# Patient Record
Sex: Female | Born: 1955 | Race: White | Hispanic: No | Marital: Married | Smoking: Never smoker
Health system: Southern US, Community
[De-identification: ages and names within clinical notes are randomized; demographics above are authoritative.]

## PROBLEM LIST (undated history)

## (undated) DIAGNOSIS — Z789 Other specified health status: Secondary | ICD-10-CM

## (undated) HISTORY — PX: ECTOPIC PREGNANCY SURGERY: SHX613

---

## 2017-08-17 ENCOUNTER — Observation Stay (HOSPITAL_COMMUNITY): Payer: BLUE CROSS/BLUE SHIELD

## 2017-08-17 ENCOUNTER — Emergency Department (HOSPITAL_COMMUNITY): Payer: BLUE CROSS/BLUE SHIELD

## 2017-08-17 ENCOUNTER — Observation Stay (HOSPITAL_COMMUNITY)
Admission: EM | Admit: 2017-08-17 | Discharge: 2017-08-18 | Disposition: A | Discharge status: Home / Self Care | Payer: BLUE CROSS/BLUE SHIELD | Attending: Internal Medicine | Admitting: Internal Medicine

## 2017-08-17 ENCOUNTER — Encounter (HOSPITAL_COMMUNITY): Payer: Self-pay | Admitting: *Deleted

## 2017-08-17 DIAGNOSIS — F419 Anxiety disorder, unspecified: Secondary | ICD-10-CM | POA: Insufficient documentation

## 2017-08-17 DIAGNOSIS — D320 Benign neoplasm of cerebral meninges: Secondary | ICD-10-CM | POA: Insufficient documentation

## 2017-08-17 DIAGNOSIS — G459 Transient cerebral ischemic attack, unspecified: Secondary | ICD-10-CM

## 2017-08-17 DIAGNOSIS — R479 Unspecified speech disturbances: Secondary | ICD-10-CM

## 2017-08-17 DIAGNOSIS — G930 Cerebral cysts: Secondary | ICD-10-CM | POA: Insufficient documentation

## 2017-08-17 DIAGNOSIS — R531 Weakness: Secondary | ICD-10-CM

## 2017-08-17 DIAGNOSIS — R42 Dizziness and giddiness: Principal | ICD-10-CM | POA: Insufficient documentation

## 2017-08-17 DIAGNOSIS — I4510 Unspecified right bundle-branch block: Secondary | ICD-10-CM | POA: Diagnosis not present

## 2017-08-17 DIAGNOSIS — R110 Nausea: Secondary | ICD-10-CM | POA: Diagnosis present

## 2017-08-17 DIAGNOSIS — Z79899 Other long term (current) drug therapy: Secondary | ICD-10-CM | POA: Insufficient documentation

## 2017-08-17 DIAGNOSIS — R0602 Shortness of breath: Secondary | ICD-10-CM

## 2017-08-17 HISTORY — DX: Other specified health status: Z78.9

## 2017-08-17 LAB — I-STAT BETA HCG BLOOD, ED (MC, WL, AP ONLY): HCG, QUANTITATIVE: 5.9 m[IU]/mL — AB (ref <5)

## 2017-08-17 LAB — COMPREHENSIVE METABOLIC PANEL WITH GFR
ALT: 19 U/L (ref 14–54)
AST: 16 U/L (ref 15–41)
Albumin: 4.3 g/dL (ref 3.5–5.0)
Alkaline Phosphatase: 90 U/L (ref 38–126)
Anion gap: 8 (ref 5–15)
BUN: 23 mg/dL — ABNORMAL HIGH (ref 6–20)
CO2: 26 mmol/L (ref 22–32)
Calcium: 9.2 mg/dL (ref 8.9–10.3)
Chloride: 103 mmol/L (ref 101–111)
Creatinine, Ser: 0.7 mg/dL (ref 0.44–1.00)
GFR calc Af Amer: >60 mL/min
GFR calc non Af Amer: >60 mL/min
Glucose, Bld: 136 mg/dL — ABNORMAL HIGH (ref 65–99)
Potassium: 4 mmol/L (ref 3.5–5.1)
Sodium: 137 mmol/L (ref 135–145)
Total Bilirubin: 0.4 mg/dL (ref 0.3–1.2)
Total Protein: 7.3 g/dL (ref 6.5–8.1)

## 2017-08-17 LAB — URINALYSIS, ROUTINE W REFLEX MICROSCOPIC
BILIRUBIN URINE: NEGATIVE
Glucose, UA: NEGATIVE mg/dL
Hgb urine dipstick: NEGATIVE
Ketones, ur: NEGATIVE mg/dL
LEUKOCYTES UA: NEGATIVE
NITRITE: NEGATIVE
Protein, ur: NEGATIVE mg/dL
Specific Gravity, Urine: 1.014 (ref 1.005–1.030)
pH: 6 (ref 5.0–8.0)

## 2017-08-17 LAB — PROTIME-INR
INR: 1.07
Prothrombin Time: 13.8 seconds (ref 11.4–15.2)

## 2017-08-17 LAB — CBC WITH DIFFERENTIAL/PLATELET
BASOS ABS: 0 10*3/uL (ref 0.0–0.1)
Basophils Relative: 0 %
Eosinophils Absolute: 0.1 10*3/uL (ref 0.0–0.7)
Eosinophils Relative: 1 %
HEMATOCRIT: 42.7 % (ref 36.0–46.0)
Hemoglobin: 13.8 g/dL (ref 12.0–15.0)
Lymphocytes Relative: 20 %
Lymphs Abs: 1.9 10*3/uL (ref 0.7–4.0)
MCH: 28.9 pg (ref 26.0–34.0)
MCHC: 32.3 g/dL (ref 30.0–36.0)
MCV: 89.5 fL (ref 78.0–100.0)
MONO ABS: 0.5 10*3/uL (ref 0.1–1.0)
Monocytes Relative: 5 %
NEUTROS ABS: 7 10*3/uL (ref 1.7–7.7)
Neutrophils Relative %: 74 %
Platelets: 253 10*3/uL (ref 150–400)
RBC: 4.77 MIL/uL (ref 3.87–5.11)
RDW: 13.6 % (ref 11.5–15.5)
WBC: 9.5 10*3/uL (ref 4.0–10.5)

## 2017-08-17 LAB — LIPASE, BLOOD: Lipase: 25 U/L (ref 11–51)

## 2017-08-17 LAB — RAPID URINE DRUG SCREEN, HOSP PERFORMED
AMPHETAMINES: NOT DETECTED
Barbiturates: NOT DETECTED
Benzodiazepines: NOT DETECTED
Cocaine: NOT DETECTED
OPIATES: NOT DETECTED
Tetrahydrocannabinol: NOT DETECTED

## 2017-08-17 LAB — I-STAT TROPONIN, ED: Troponin i, poc: 0 ng/mL (ref 0.00–0.08)

## 2017-08-17 LAB — APTT: APTT: 26 s (ref 24–36)

## 2017-08-17 LAB — POC URINE PREG, ED: PREG TEST UR: NEGATIVE

## 2017-08-17 LAB — ETHANOL: Alcohol, Ethyl (B): <10 mg/dL (ref <10)

## 2017-08-17 MED ORDER — SPIRONOLACTONE 25 MG PO TABS
100.0000 mg | ORAL_TABLET | Freq: Every day | ORAL | Status: DC
  Administered 2017-08-18: 100 mg via ORAL
  Filled 2017-08-17: qty 4

## 2017-08-17 MED ORDER — MECLIZINE HCL 25 MG PO TABS: 25.0000 mg | ORAL_TABLET | Freq: Three times a day (TID) | ORAL | Status: DC | PRN

## 2017-08-17 MED ORDER — FLUOXETINE HCL 20 MG PO CAPS
20.0000 mg | ORAL_CAPSULE | Freq: Every day | ORAL | Status: DC
  Administered 2017-08-18: 20 mg via ORAL
  Filled 2017-08-17 (×3): qty 1

## 2017-08-17 MED ORDER — MECLIZINE HCL 25 MG PO TABS
50.0000 mg | ORAL_TABLET | Freq: Once | ORAL | Status: AC
  Administered 2017-08-17: 50 mg via ORAL
  Filled 2017-08-17: qty 2

## 2017-08-17 MED ORDER — ONDANSETRON HCL 4 MG/2ML IJ SOLN: 4.0000 mg | Freq: Four times a day (QID) | INTRAMUSCULAR | Status: DC | PRN

## 2017-08-17 MED ORDER — ONDANSETRON HCL 4 MG PO TABS: 4.0000 mg | ORAL_TABLET | Freq: Four times a day (QID) | ORAL | Status: DC | PRN

## 2017-08-17 MED ORDER — SODIUM CHLORIDE 0.9 % IV BOLUS
1000.0000 mL | Freq: Once | INTRAVENOUS | Status: AC
  Administered 2017-08-17: 1000 mL via INTRAVENOUS

## 2017-08-17 MED ORDER — ENOXAPARIN SODIUM 40 MG/0.4ML ~~LOC~~ SOLN
40.0000 mg | SUBCUTANEOUS | Status: DC
  Administered 2017-08-18: 40 mg via SUBCUTANEOUS
  Filled 2017-08-17 (×2): qty 0.4

## 2017-08-17 NOTE — ED Notes (Signed)
ED TO INPATIENT HANDOFF REPORT  Name/Age/Gender Andrea Luna 62 y.o. female  Code Status   Home/SNF/Other Home  Chief Complaint weakness / nausea  Level of Care/Admitting Diagnosis ED Disposition    ED Disposition Condition Comment   [facility] Area: [place] [[id number]]  Level of Care: Telemetry [5]  Admit to tele based on following criteria: Other see comments  Comments: Vertigo  Diagnosis: Vertigo [093818]  Admitting Physician: Kara Pacer  Attending Physician: Bethena Roys Nessa.Cuff  PT Class (Do Not Modify): Observation [104]  PT Acc Code (Do Not Modify): Observation [10022]       Medical History Past Medical History:  Diagnosis Date  . Medical history non-contributory     Allergies No Known Allergies  IV Location/Drains/Wounds Patient Lines/Drains/Airways Status   Active Line/Drains/Airways    Name:   Placement date:   Placement time:   Site:   Days:   Peripheral IV 08/17/17 Right Hand   08/17/17    -    Hand   less than 1          Labs/Imaging Results for orders placed or performed during the hospital encounter of 08/17/17 (from the past 48 hour(s))  Protime-INR     Status: None   Collection Time: 08/17/17  3:10 PM  Result Value Ref Range   Prothrombin Time 13.8 11.4 - 15.2 seconds   INR 1.07     Comment: Performed at [facility], [place] [address] [place], [place] [address]  APTT     Status: None   Collection Time: 08/17/17  3:10 PM  Result Value Ref Range   aPTT 26 24 - 36 seconds    Comment: Performed at [facility], [place] [address], [place], [place] [address]  CBC with Differential     Status: None   Collection Time: 08/17/17  3:11 PM  Result Value Ref Range   WBC 9.5 4.0 - 10.5 K/uL   RBC 4.77 3.87 - 5.11 MIL/uL   Hemoglobin 13.8 12.0 - 15.0 g/dL   HCT 42.7 36.0 - 46.0 %   MCV 89.5 78.0 - 100.0 fL   MCH 28.9 26.0 - 34.0 pg   MCHC 32.3 30.0 - 36.0  g/dL   RDW 13.6 11.5 - 15.5 %   Platelets 253 150 - 400 K/uL   Neutrophils Relative % 74 %   Neutro Abs 7.0 1.7 - 7.7 K/uL   Lymphocytes Relative 20 %   Lymphs Abs 1.9 0.7 - 4.0 K/uL   Monocytes Relative 5 %   Monocytes Absolute 0.5 0.1 - 1.0 K/uL   Eosinophils Relative 1 %   Eosinophils Absolute 0.1 0.0 - 0.7 K/uL   Basophils Relative 0 %   Basophils Absolute 0.0 0.0 - 0.1 K/uL    Comment: Performed at [facility], [place] [address], [place], [place] [address]  Comprehensive metabolic panel     Status: Abnormal   Collection Time: 08/17/17  3:11 PM  Result Value Ref Range   Sodium 137 135 - 145 mmol/L   Potassium 4.0 3.5 - 5.1 mmol/L   Chloride 103 101 - 111 mmol/L   CO2 26 22 - 32 mmol/L   Glucose, Bld 136 (H) 65 - 99 mg/dL   BUN 23 (H) 6 - 20 mg/dL   Creatinine, Ser 0.70 0.44 - 1.00 mg/dL   Calcium 9.2 8.9 - 10.3 mg/dL   Total Protein 7.3 6.5 - 8.1 g/dL   Albumin  4.3 3.5 - 5.0 g/dL   AST 16 15 - 41 U/L   ALT 19 14 - 54 U/L   Alkaline Phosphatase 90 38 - 126 U/L   Total Bilirubin 0.4 0.3 - 1.2 mg/dL   GFR calc non Af Amer >60 >60 mL/min   GFR calc Af Amer >60 >60 mL/min    Comment: (NOTE) The eGFR has been calculated using the CKD EPI equation. This calculation has not been validated in all clinical situations. eGFR's persistently <60 mL/min signify possible Chronic Kidney Disease.    Anion gap 8 5 - 15    Comment: Performed at [facility], [place] [address], [place], [place] [address]  Lipase, blood     Status: None   Collection Time: 08/17/17  3:11 PM  Result Value Ref Range   Lipase 25 11 - 51 U/L    Comment: Performed at [facility], [place] [address], [place], [place] [address]  I-Stat beta hCG blood, ED     Status: Abnormal   Collection Time: 08/17/17  3:15 PM  Result Value Ref Range   I-stat hCG, quantitative 5.9 (H) <5 mIU/mL   Comment 3            Comment:   GEST. AGE      CONC.  (mIU/mL)   <=1 WEEK         5 - 50     2 WEEKS       50 - 500     3 WEEKS       100 - 10,000     4 WEEKS     1,000 - 30,000        FEMALE AND NON-PREGNANT FEMALE:     LESS THAN 5 mIU/mL   I-stat troponin, ED     Status: None   Collection Time: 08/17/17  3:16 PM  Result Value Ref Range   Troponin i, poc 0.00 0.00 - 0.08 ng/mL   Comment 3            Comment: Due to the release kinetics of cTnI, a negative result within the first hours of the onset of symptoms does not rule out myocardial infarction with certainty. If myocardial infarction is still suspected, repeat the test at appropriate intervals.   Ethanol     Status: None   Collection Time: 08/17/17  3:35 PM  Result Value Ref Range   Alcohol, Ethyl (B) <10 <10 mg/dL    Comment: (NOTE) Lowest detectable limit for serum alcohol is 10 mg/dL. For medical purposes only. Performed at [facility], [place] [address] [place], [place] [address]   Urinalysis, Routine w reflex microscopic     Status: None   Collection Time: 08/17/17  5:39 PM  Result Value Ref Range   Color, Urine YELLOW YELLOW   APPearance CLEAR CLEAR   Specific Gravity, Urine 1.014 1.005 - 1.030   pH 6.0 5.0 - 8.0   Glucose, UA NEGATIVE NEGATIVE mg/dL   Hgb urine dipstick NEGATIVE NEGATIVE   Bilirubin Urine NEGATIVE NEGATIVE   Ketones, ur NEGATIVE NEGATIVE mg/dL   Protein, ur NEGATIVE NEGATIVE mg/dL   Nitrite NEGATIVE NEGATIVE   Leukocytes, UA NEGATIVE NEGATIVE    Comment: Performed at [place] [address], [place], [place] [address]  Urine rapid drug screen (hosp performed)     Status: None   Collection Time: 08/17/17  5:39 PM  Result Value Ref Range   Opiates NONE DETECTED  NONE DETECTED   Cocaine NONE DETECTED NONE DETECTED   Benzodiazepines NONE DETECTED NONE DETECTED   Amphetamines NONE DETECTED NONE DETECTED   Tetrahydrocannabinol NONE DETECTED NONE DETECTED   Barbiturates NONE DETECTED NONE DETECTED    Comment: (NOTE) DRUG SCREEN  FOR MEDICAL PURPOSES ONLY.  IF CONFIRMATION IS NEEDED FOR ANY PURPOSE, NOTIFY LAB WITHIN 5 DAYS. LOWEST DETECTABLE LIMITS FOR URINE DRUG SCREEN Drug Class                     Cutoff (ng/mL) Amphetamine and metabolites    1000 Barbiturate and metabolites    200 Benzodiazepine                 035 Tricyclics and metabolites     300 Opiates and metabolites        300 Cocaine and metabolites        300 THC                            50 Performed at [facility], [place] [address] [place], [place] [address]   POC urine preg, ED (not at [facility])     Status: None   Collection Time: 08/17/17  5:40 PM  Result Value Ref Range   Preg Test, Ur NEGATIVE NEGATIVE    Comment:        THE SENSITIVITY OF THIS METHODOLOGY IS >24 mIU/mL    Dg Chest 2 View  Result Date: 08/17/2017 CLINICAL DATA:  Chest pain, dizziness, cold sweats,generalized weakness and nausea. Patient state she was driving went she suddenly fell weakness all over her body. EXAM: CHEST - 2 VIEW COMPARISON:  None. FINDINGS: Lateral view degraded by patient arm position. Numerous leads and wires project over the chest. Midline trachea. Borderline cardiomegaly. No pleural effusion or pneumothorax. Clear lungs. IMPRESSION: Borderline cardiomegaly, without acute disease. Electronically Signed   By: Abigail Miyamoto M.D.   On: 08/17/2017 19:50   Ct Head Wo Contrast  Result Date: 08/17/2017 CLINICAL DATA:  Dizziness and nausea.  Transient facial numbness EXAM: CT HEAD WITHOUT CONTRAST TECHNIQUE: Contiguous axial images were obtained from the base of the skull through the vertex without intravenous contrast. COMPARISON:  None. FINDINGS: Brain: The ventricles are normal in size and configuration. There is an arachnoid cyst in the anterior left temporal region measuring 2.6 x 1.4 cm with remodeling of the anterior left temporal lobe. There is a calcified meningioma arising from the dura in the high right parietal region measuring 1.6 x  1.5 cm without surrounding edema or mass effect. No other mass evident. There is no hemorrhage, extra-axial fluid collection, or midline shift. The gray-white compartments appear normal. No evident acute infarct. Vascular: No hyperdense vessel. No vascular calcifications are evident. Skull: The bony calvarium appears intact. Sinuses/Orbits: There is mucosal thickening in several ethmoid air cells. Other paranasal sinuses which are visualized are clear. Orbits appear symmetric bilaterally. Other: Mastoid air cells are clear. IMPRESSION: 1. 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 2. Calcified meningioma arising from the dural in the high right parietal region without surrounding edema or mass effect. 3. No intra-axial mass or hemorrhage. Gray-white compartments appear normal. No acute infarct evident. 4.  Mucosal thickening in several ethmoid air cells. Electronically Signed   By: Lowella Grip III M.D.   On: 08/17/2017 16:09    Pending Labs Unresulted Labs (From admission, onward)  Start     Ordered   Signed and Held  HIV antibody (Routine Testing)  Once,   R     Signed and Held      Vitals/Pain Today's Vitals   08/17/17 1600 08/17/17 1755 08/17/17 1900 08/17/17 2000  BP: 132/65 (!) 143/72 137/75 129/67  Pulse: 66 71 72 71  Resp: _0 Temp:      TempSrc:      SpO2: 98% 93% 95% 94%  Weight:      Height:      PainSc:        Isolation Precautions No active isolations  Medications Medications  sodium chloride 0.9 % bolus 1,000 mL (0 mLs Intravenous Stopped 08/17/17 1703)  meclizine (ANTIVERT) tablet 50 mg (50 mg Oral Given 08/17/17 1538)    Mobility walks

## 2017-08-17 NOTE — ED Provider Notes (Signed)
[facility] DEPT Provider Note   CSN: [id number] Arrival date & time: 08/17/17  1402     History   Chief Complaint No chief complaint on file.   HPI Andrea Luna is a 62 y.o. otherwise healthy female with no reported PMHx, who presents to the ED via EMS accompanied by her daughters who assist with history, with complaints of sudden onset generalized weakness, vertigo/dizzy spinning sensation, nausea, diaphoresis, difficulty word finding, and difficulty focusing her vision due to the spinning sensation, which all occurred at 1 PM while she was sitting in her car in the [employer] food line.  She states that she was sitting there when she started not feeling well, she called her daughter and her daughter states that she seemed to not be able to get her words out, was not communicating clearly.  Patient states that she "couldn't see" because she felt like she was spinning and she couldn't focus on the things in front of her; this was binocular, and she denies any loss of vision, spots in vision, or diplopia.  She reports that she felt very diaphoretic and nauseated, and just felt generally fatigued and weak.  She initially felt somewhat short of breath however this has resolved.  EMS gave her 4 mg IV Zofran which did not help her symptoms, and she states that the movement of the ambulance on the ride over here made her symptoms worse.  She reports her symptoms started to improve once she got here however she continues to feel very fatigued and generally weak.  At 7 AM this morning she had a sausage on an English muffin with coffee and some water, she has had a little bit of water since then but she has not eaten anything since 7 AM.  Her PCP is Lanier Prude.   She denies having any PMHx that she's aware of.    She denies fevers, chills, HA, diplopia/loss of vision, ear pain/drainage, tinnitus, CP, ongoing SOB, abd pain, vomiting, recent diarrhea/constipation, melena,  hematochezia, hematuria, dysuria, myalgias, arthralgias, numbness, tingling, focal unilateral weakness, or any other complaints at this time.   The history is provided by the patient, medical records and a relative. No language interpreter was used.    No past medical history on file.  There are no active problems to display for this patient.     OB History   None      Home Medications    Prior to Admission medications   Medication Sig Start Date End Date Taking? Authorizing Provider  FLUoxetine (PROZAC) 20 MG capsule Take 20 mg by mouth daily.   Yes [provider]  spironolactone (ALDACTONE) 100 MG tablet Take 100 mg by mouth daily.   Yes [provider]    Family History No family history on file.  Social History Social History   Tobacco Use  . Smoking status: Not on file  Substance Use Topics  . Alcohol use: Not on file  . Drug use: Not on file     Allergies   Patient has no known allergies.   Review of Systems Review of Systems  Constitutional: Positive for diaphoresis and fatigue. Negative for chills and fever.  HENT: Negative for ear discharge, ear pain and tinnitus.   Eyes: Positive for visual disturbance (difficulty focusing due to vertigo).  Respiratory: Positive for shortness of breath (initially but not ongoing).   Cardiovascular: Negative for chest pain.  Gastrointestinal: Positive for nausea. Negative for abdominal pain, blood in stool,  constipation, diarrhea and vomiting.  Genitourinary: Negative for dysuria and hematuria.  Musculoskeletal: Negative for arthralgias and myalgias.  Skin: Negative for color change.  Allergic/Immunologic: Negative for immunocompromised state.  Neurological: Positive for dizziness and speech difficulty. Negative for syncope, weakness, numbness and headaches.   All other systems reviewed and are negative for acute change except as noted in the HPI.    Physical Exam Updated Vital Signs BP 137/78  (BP Location: Left Arm)   Pulse 61   Temp (!) 97.5 F (36.4 C) (Oral)   Resp 13   Ht 5\' 8"  (1.727 m)   Wt 97.5 kg (215 lb)   SpO2 94%   BMI 32.69 kg/m   Physical Exam  Constitutional: She is oriented to person, place, and time. Vital signs are normal. She appears well-developed and well-nourished.  Non-toxic appearance. No distress.  Afebrile, nontoxic, NAD  HENT:  Head: Normocephalic and atraumatic.  Mouth/Throat: Oropharynx is clear and moist and mucous membranes are normal.  Symmetric facial movements  Eyes: Pupils are equal, round, and reactive to light. Conjunctivae and EOM are normal. Right eye exhibits no discharge. Left eye exhibits no discharge.  PERRL, EOMI, no nystagmus  Neck: Normal range of motion. Neck supple. No spinous process tenderness and no muscular tenderness present. No neck rigidity. Normal range of motion present.  FROM intact without spinous process TTP, no bony stepoffs or deformities, no paraspinous muscle TTP or muscle spasms. No rigidity or meningeal signs. No bruising or swelling.   Cardiovascular: Normal rate, regular rhythm, normal heart sounds and intact distal pulses. Exam reveals no gallop and no friction rub.  No murmur heard. Pulmonary/Chest: Effort normal and breath sounds normal. No respiratory distress. She has no decreased breath sounds. She has no wheezes. She has no rhonchi. She has no rales.  Abdominal: Soft. Normal appearance and bowel sounds are normal. She exhibits no distension. There is no tenderness. There is no rigidity, no rebound, no guarding, no CVA tenderness, no tenderness at McBurney's point and negative Murphy's sign.  Musculoskeletal: Normal range of motion.  Not very cooperative with musculoskeletal testing however appears to move all extremities symmetrically, overall strength is symmetric and grossly intact in all extremities although she is not very cooperative with this portion of the exam; later seen showing a child the IV in  her R hand using her L hand to point to it; sensation grossly intact in all extremities Distal pulses intact  Neurological: She is alert and oriented to person, place, and time. She has normal strength. No cranial nerve deficit or sensory deficit. Coordination normal. GCS eye subscore is 4. GCS verbal subscore is 5. GCS motor subscore is 6.  CN 2-12 grossly intact A&O x4 GCS 15 Sensation and strength intact as mentioned previously Gait not assessed initially Coordination WNL Neg pronator drift  Speech clear  Skin: Skin is warm, dry and intact. No rash noted.  Psychiatric: She has a normal mood and affect.  Nursing note and vitals reviewed.    ED Treatments / Results  Labs (all labs ordered are listed, but only abnormal results are displayed) Labs Reviewed  COMPREHENSIVE METABOLIC PANEL - Abnormal; Notable for the following components:      Result Value   Glucose, Bld 136 (*)    BUN 23 (*)    All other components within normal limits  I-STAT BETA HCG BLOOD, ED (MC, WL, AP ONLY) - Abnormal; Notable for the following components:   I-stat hCG, quantitative 5.9 (*)  All other components within normal limits  URINALYSIS, ROUTINE W REFLEX MICROSCOPIC  CBC WITH DIFFERENTIAL/PLATELET  LIPASE, BLOOD  ETHANOL  PROTIME-INR  APTT  RAPID URINE DRUG SCREEN, HOSP PERFORMED  CBG MONITORING, ED  I-STAT TROPONIN, ED  POC URINE PREG, ED    EKG EKG Interpretation  Date/Time:  Tuesday August 17 2017 15:11:54 EDT Ventricular Rate:  71 PR Interval:    QRS Duration: 108 QT Interval:  410 QTC Calculation: 446 R Axis:   3 Text Interpretation:  Age not entered, assumed to be  62 years old for purpose of ECG interpretation Sinus rhythm Abnormal R-wave progression, early transition Confirmed by Milton Ferguson [phone]) on 08/17/2017 4:55:40 PM   Radiology Ct Head Wo Contrast  Result Date: 08/17/2017 CLINICAL DATA:  Dizziness and nausea.  Transient facial numbness EXAM: CT HEAD WITHOUT  CONTRAST TECHNIQUE: Contiguous axial images were obtained from the base of the skull through the vertex without intravenous contrast. COMPARISON:  None. FINDINGS: Brain: The ventricles are normal in size and configuration. There is an arachnoid cyst in the anterior left temporal region measuring 2.6 x 1.4 cm with remodeling of the anterior left temporal lobe. There is a calcified meningioma arising from the dura in the high right parietal region measuring 1.6 x 1.5 cm without surrounding edema or mass effect. No other mass evident. There is no hemorrhage, extra-axial fluid collection, or midline shift. The gray-white compartments appear normal. No evident acute infarct. Vascular: No hyperdense vessel. No vascular calcifications are evident. Skull: The bony calvarium appears intact. Sinuses/Orbits: There is mucosal thickening in several ethmoid air cells. Other paranasal sinuses which are visualized are clear. Orbits appear symmetric bilaterally. Other: Mastoid air cells are clear. IMPRESSION: 1. 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 2. Calcified meningioma arising from the dural in the high right parietal region without surrounding edema or mass effect. 3. No intra-axial mass or hemorrhage. Gray-white compartments appear normal. No acute infarct evident. 4.  Mucosal thickening in several ethmoid air cells. Electronically Signed   By: Lowella Grip III M.D.   On: 08/17/2017 16:09    Procedures Procedures (including critical care time)  Medications Ordered in ED Medications  sodium chloride 0.9 % bolus 1,000 mL (1,000 mLs Intravenous New Bag/Given 08/17/17 1541)  meclizine (ANTIVERT) tablet 50 mg (50 mg Oral Given 08/17/17 1538)     Initial Impression / Assessment and Plan / ED Course  I have reviewed the triage vital signs and the nursing notes.  Pertinent labs & imaging results that were available during my care of the  patient were reviewed by me and considered in my medical decision making (see chart for details).     62 y.o. female here with sudden onset generalized weakness/fatigue, dizziness, nausea, difficulty word finding, and diaphoresis. Initially SOB but no longer feels that way. Hasn't eaten much today. On exam, no focal neuro deficits, not very cooperative with strength testing but appears to be able to move all extremities without difficulty and strength is symmetrically and grossly intact. A&O x4. No diaphoretic on exam. Clear lung exam, no tachycardia or hypoxia. Does not have any difficulty word finding or any slurred speech on exam. Doubt need for code stroke, but will get stroke-like work up including labs, CT head, and EKG. Will give meclizine and fluids and reassess shortly. Discussed case with my attending Dr. Dayna Barker who agrees with plan.   4:56 PM CBC w/diff WNL. CMP with marginally elevated  gluc 136 and BUN 23 but otherwise WNL. Lipase WNL. BetaHCG falsely elevated at 5.9, doubt true positive. EtOH level undetectable. INR WNL. APTT WNL. Trop negative. EKG without acute ischemic findings. CT head showing benign arachnoid cyst in anterior L temporal region with chronic remodeling of anterior left temporal lobe, calcified meningioma arising from dural in high right parietal region without surrounding edema or mass effect, and mucosal thickening of ethmoid air cells, but otherwise no acute findings. Will discuss case with neurosurgeon to see if these findings would correlate with her symptoms from today. Of note, pt wants Upreg to ensure that the [facility] is actually a false positive; will get this now as well. Also, pt feeling somewhat better after fluids and meclizine.   5:41 PM Dr. Annette Stable of neurosurgery returning page states meningioma should be followed outpatient but none of these findings would correlate directly with her symptoms today. Doesn't feel this is the cause of her symptoms. She can  f/up with him outpatient to discuss further monitoring of the meningioma. 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 Will proceed with admission for TIA work up. Upreg negative, rest of urine sample tests are pending.    6:38 PM UDS negative. U/A unremarkable. Dr. Denton Brick of [facility] returning page and will admit. Holding orders to be placed by admitting team. Please see their notes for further documentation of care. I appreciate their help with this pleasant pt's care. Pt stable at time of admission.    Final Clinical Impressions(s) / ED Diagnoses   Final diagnoses:  Transient ischemic attack (TIA)  Generalized weakness  Nausea  Vertigo  Difficulty with speech    ED Discharge Orders    [address] [place], [place] 08/17/17 Cecille Aver, MD 08/18/17 [phone]

## 2017-08-17 NOTE — ED Triage Notes (Signed)
Patient presented to ed with c/o generalized weakness and nausea. Patient state she was driving went she suddenly fell weakness all over her body. Patient was given 4 mg of Zofran iv

## 2017-08-17 NOTE — ED Notes (Signed)
Gave report to Ray, RN for room 1427.

## 2017-08-17 NOTE — ED Notes (Signed)
Bed: WA08 Expected date:  Expected time:  Means of arrival:  Comments: Ems

## 2017-08-17 NOTE — H&P (Addendum)
History and Physical    Teesha Klinker HYW:[id number] DOB: Nov 09, 1955 DOA: 08/17/2017  PCP: Manfred Shirts, PA   Patient coming from: Home   Chief Complaint: vertigo  HPI: Andrea Luna is a 62 y.o. female with medical history significant for anxiety, presented to the ED with complaints of sudden dizziness that started while she was sitting in her car waiting in line at [facility].  She describes the sensation of her head spinning, such that it hurt to open her eyes, with associated nausea but no vomiting.  She tried to drive was unable and stopped by the side of the road.  She reports vision change was because when she opened her eyes she was very dizzy.  Patient reports subsequently feeling "very sweaty, and drained/exhausted such that she could not lift her arms or legs.  At this time she also had an episode of difficulty catching her breath lasting a few minutes that resolved.  Patient ate an egg muffin for breakfast, denies-vomiting, loose stools or abdominal pain, she denies chest pain, no cough no leg swelling.  She denies ringing in her ears, hearing problems, ear fullness or pain. Patient subsequently called her daughter who felt patient was having difficulty getting her words out, daughter reports patient's words did not make a lot of sense but patient reports she knew exactly what she was saying.  ED Course: Temperature 97.5 stable vitals.  Troponin x1-, UA UDS clean, WBC 9.5 hemoglobin 13.8, creatinine 0.7 , glucose 136 , unremarkable lytes.  Negative blood alcohol. CT without contrast-benign arachnoid cyst-left temporal region, calcified meningioma high right parietal region- ( See detailed report).  Neurosurgery was consulted in the ED with recommendations that-arachnoid cyst likely present from birth, imaging findings do not correlate with patient's symptoms, patient can follow-up with him as outpatient for further monitoring of her meningioma.   Patient was given meclizine in the ED. At  the time of my evaluation patient felt back to baseline. Hospitalist was called to admit for TIA work-up.   Review of Systems: As per HPI otherwise 10 point review of systems negative.  Past Medical History:  Diagnosis Date  . Medical history non-contributory     Past Surgical History:  Procedure Laterality Date  . ECTOPIC PREGNANCY SURGERY       reports that she has never smoked. She has never used smokeless tobacco. She reports that she does not drink alcohol or use drugs.  No Known Allergies  History reviewed. No pertinent family history.  Prior to Admission medications   Medication Sig Start Date End Date Taking? Authorizing Provider  FLUoxetine (PROZAC) 20 MG capsule Take 20 mg by mouth daily.   Yes [provider]  spironolactone (ALDACTONE) 100 MG tablet Take 100 mg by mouth daily.   Yes [provider]    Physical Exam: Vitals:   08/17/17 1600 08/17/17 1755 08/17/17 1900 08/17/17 2000  BP: 132/65 (!) 143/72 137/75 129/67  Pulse: 66 71 72 71  Resp: 12 18 17 17   Temp:      TempSrc:      SpO2: 98% 93% 95% 94%  Weight:      Height:        Constitutional: NAD, calm, comfortable Vitals:   08/17/17 1600 08/17/17 1755 08/17/17 1900 08/17/17 2000  BP: 132/65 (!) 143/72 137/75 129/67  Pulse: 66 71 72 71  Resp: 12 18 17 17   Temp:      TempSrc:      SpO2: 98% 93% 95% 94%  Weight:      Height:       Eyes: PERRL, lids and conjunctivae normal ENMT: Mucous membranes are moist. Posterior pharynx clear of any exudate or lesions.Normal dentition.  Neck: normal, supple, no masses, no thyromegaly Respiratory: clear to auscultation bilaterally, no wheezing, no crackles. Normal respiratory effort. No accessory muscle use.  Cardiovascular: Regular rate and rhythm, no murmurs / rubs / gallops. No extremity edema. 2+ pedal pulses. No carotid bruits.  Abdomen: no tenderness, no masses palpated. No hepatosplenomegaly. Bowel sounds positive.  Musculoskeletal: no  clubbing / cyanosis. No joint deformity upper and lower extremities. Good ROM, no contractures. Normal muscle tone.  Skin: no rashes, lesions, ulcers. No induration Neurologic: CN 2-12 grossly intact. Sensation intact, . Strength 5/5 in all 4. Finger-to-nose, heel-to-shin test normal Psychiatric: Normal judgment and insight. Alert and oriented x 3. Normal mood.   Labs on Admission: I have personally reviewed following labs and imaging studies  CBC: Recent Labs  Lab 08/17/17 1511  WBC 9.5  NEUTROABS 7.0  HGB 13.8  HCT 42.7  MCV 89.5  PLT 756   Basic Metabolic Panel: Recent Labs  Lab 08/17/17 1511  NA 137  K 4.0  CL 103  CO2 26  GLUCOSE 136*  BUN 23*  CREATININE 0.70  CALCIUM 9.2   Liver Function Tests: Recent Labs  Lab 08/17/17 1511  AST 16  ALT 19  ALKPHOS 90  BILITOT 0.4  PROT 7.3  ALBUMIN 4.3   Recent Labs  Lab 08/17/17 1511  LIPASE 25    Recent Labs  Lab 08/17/17 1510  INR 1.07   Urine analysis:    Component Value Date/Time   COLORURINE YELLOW 08/17/2017 [place] 08/17/2017 1739   LABSPEC 1.014 08/17/2017 1739   PHURINE 6.0 08/17/2017 1739   GLUCOSEU NEGATIVE 08/17/2017 1739   HGBUR NEGATIVE 08/17/2017 1739   BILIRUBINUR NEGATIVE 08/17/2017 1739   KETONESUR NEGATIVE 08/17/2017 1739   PROTEINUR NEGATIVE 08/17/2017 1739   NITRITE NEGATIVE 08/17/2017 1739   LEUKOCYTESUR NEGATIVE 08/17/2017 1739    Radiological Exams on Admission: Dg Chest 2 View  Result Date: 08/17/2017 CLINICAL DATA:  Chest pain, dizziness, cold sweats,generalized weakness and nausea. Patient state she was driving went she suddenly fell weakness all over her body. EXAM: CHEST - 2 VIEW COMPARISON:  None. FINDINGS: Lateral view degraded by patient arm position. Numerous leads and wires project over the chest. Midline trachea. Borderline cardiomegaly. No pleural effusion or pneumothorax. Clear lungs. IMPRESSION: Borderline cardiomegaly, without acute disease.  Electronically Signed   By: Abigail Miyamoto M.D.   On: 08/17/2017 19:50   Ct Head Wo Contrast  Result Date: 08/17/2017 CLINICAL DATA:  Dizziness and nausea.  Transient facial numbness EXAM: CT HEAD WITHOUT CONTRAST TECHNIQUE: Contiguous axial images were obtained from the base of the skull through the vertex without intravenous contrast. COMPARISON:  None. FINDINGS: Brain: The ventricles are normal in size and configuration. There is an arachnoid cyst in the anterior left temporal region measuring 2.6 x 1.4 cm with remodeling of the anterior left temporal lobe. There is a calcified meningioma arising from the dura in the high right parietal region measuring 1.6 x 1.5 cm without surrounding edema or mass effect. No other mass evident. There is no hemorrhage, extra-axial fluid collection, or midline shift. The gray-white compartments appear normal. No evident acute infarct. Vascular: No hyperdense vessel. No vascular calcifications are evident. Skull: The bony calvarium appears intact. Sinuses/Orbits: There is mucosal thickening in several ethmoid air  cells. Other paranasal sinuses which are visualized are clear. Orbits appear symmetric bilaterally. Other: Mastoid air cells are clear. IMPRESSION: 1. 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 2. Calcified meningioma arising from the dural in the high right parietal region without surrounding edema or mass effect. 3. No intra-axial mass or hemorrhage. Gray-white compartments appear normal. No acute infarct evident. 4.  Mucosal thickening in several ethmoid air cells. Electronically Signed   By: Lowella Grip III M.D.   On: 08/17/2017 16:09    EKG: Independently reviewed.  EKG QTC 446, sinus rhythm.  Incomplete RBBB, nonspecific T wave inversions aVL V2.  No EKG to compare  Assessment/Plan Active Problems:   Vertigo   Vertigo-with nausea, no order deficits to suggest CVA.  She denies  vision change, she sees her eyes with because of her vertiginous symptoms. CT head shows meningioma and arachnoid cyst.  Neurosurgery recommends following up outpatient for meningioma monitoring, (likely present from breath, meeting findings do not correlate with patient's symptoms.  -Rule out CVA with MRI -Meclizine as needed -Two-view chest x-ray for reported shortness of breath negative for acute abnormality. SOB, diaphoresis fatigue likely related to anxiety secondary to vertigo -Orthostatic vitals (pt already given 1L bolus in ED) -Vertigo likely of peripheral etiology.  Anxiety-symptoms today started about an hour after patient  Daughter in law who is 32-week preg was likely to give birth- ? Relation to patient's symptoms. -Continue home fluoxetine  Patient also on spironolactone for low potassium-follow-up with PCP. K- 4.  HIV as part of routine health screening  DVT prophylaxis: Lovenox Code Status: Full Family Communication: Daughter at bedside Disposition Plan: Per rounding team Consults called: Neurosurgery Admission status:  Obs, tele   Bethena Roys MD [facility] Pager [phone] From 6PM-2AM.  Otherwise please contact night-coverage [email or web address] Password [facility]  08/17/2017, 8:49 PM

## 2017-08-18 DIAGNOSIS — R110 Nausea: Secondary | ICD-10-CM | POA: Diagnosis not present

## 2017-08-18 DIAGNOSIS — R42 Dizziness and giddiness: Secondary | ICD-10-CM | POA: Diagnosis not present

## 2017-08-18 LAB — GLUCOSE, CAPILLARY: GLUCOSE-CAPILLARY: 129 mg/dL — AB (ref 65–99)

## 2017-08-18 LAB — HIV ANTIBODY (ROUTINE TESTING W REFLEX): HIV Screen 4th Generation wRfx: NONREACTIVE

## 2017-08-18 MED ORDER — MECLIZINE HCL 25 MG PO TABS: 25.0000 mg | ORAL_TABLET | Freq: Three times a day (TID) | ORAL | 0 refills | Status: AC | PRN

## 2017-08-18 NOTE — Discharge Summary (Signed)
Physician Discharge Summary  Patient ID: Andrea Luna MRN: [id number] DOB/AGE: Jul 24, 1955 63 y.o.  Admit date: 08/17/2017 Discharge date: 08/18/2017  Admission Diagnoses:  Discharge Diagnoses:  Active Problems:   Vertigo   Discharged Condition: stable  Hospital Course: Patient is a 62 year old Caucasian female with past medical history significant for anxiety.  Patient was admitted with Vertigo.  CT without contrast revealed benign arachnoid cyst, and calcified meningioma.  Neurosurgery was consulted in the ED, and neurosurgery team was of the opinion that arachnoid cyst was likely present from birth, and advised that the patient will follow with neurosurgery team on an outpatient basis for the monitoring of the meningioma.  Hospitalist service was asked to admit patient for further assessment and work-up.  Patient was admitted and started on meclizine as needed.  MRI of the brain did not reveal any acute abnormality.  MRI of the brain revealed partially calcified right vertex meningioma without abnormality of the underlying brain.  Vertigo has resolved.  Patient is eager to be discharged back home today.  Patient will follow with the PCP and the neurosurgery team on discharge.  Consults: Neurosurgery consulted by the ED team.  Significant Diagnostic Studies: MRI of the brain.  Kindly see above.  Discharge Exam: Blood pressure 119/66, pulse 66, temperature 97.6 F (36.4 C), resp. rate 16, height 5\' 8"  (1.727 m), weight 97.5 kg (215 lb), SpO2 98 %.   Disposition: Discharge disposition: 01-Home or Self Care   Discharge Instructions    Call MD for:   Complete by:  As directed    Call MD if symptoms worsen   Diet - low sodium heart healthy   Complete by:  As directed    Increase activity slowly   Complete by:  As directed      Allergies as of 08/18/2017   No Known Allergies     Medication List    TAKE these medications   FLUoxetine 20 MG capsule Commonly known as:   PROZAC Take 20 mg by mouth daily.   meclizine 25 MG tablet Commonly known as:  ANTIVERT Take 1 tablet (25 mg total) by mouth 3 (three) times daily as needed for dizziness.   spironolactone 100 MG tablet Commonly known as:  ALDACTONE Take 100 mg by mouth daily.        SignedBonnell Public 08/18/2017, 12:19 PM

## 2018-10-18 IMAGING — MR MR HEAD W/O CM
8 of 10 series · 37 of 48 positions shown · non-contrast
Comparison: Head CT 08/17/2017

CLINICAL DATA: Vertigo

EXAM:
MRI HEAD WITHOUT CONTRAST
TECHNIQUE: Multiplanar, multiecho pulse sequences of the brain and surrounding
structures were obtained without intravenous contrast.

[Series 3: DWI · axial · 3.0mm · 1.09mm/px · z∈[-136,+23]mm · 8 of 108 slices shown (1 of 4)]
[im 1/108]
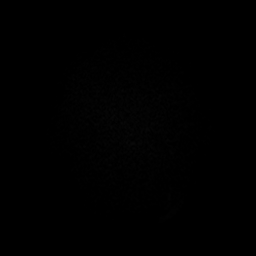
[im 12/108]
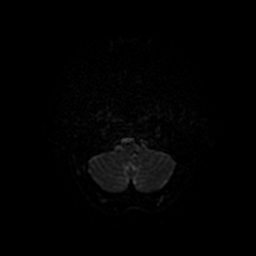
[im 36/108]
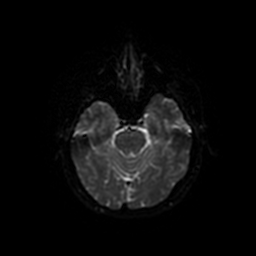
[im 48/108]
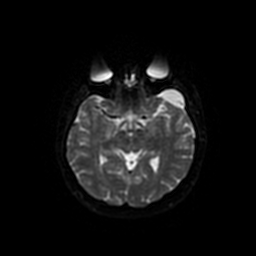
[im 60/108]
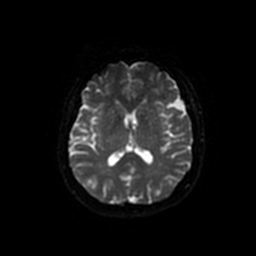
[im 72/108]
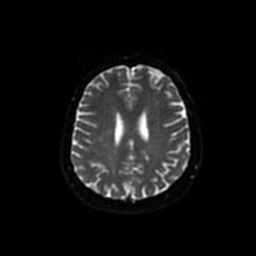
[im 96/108]
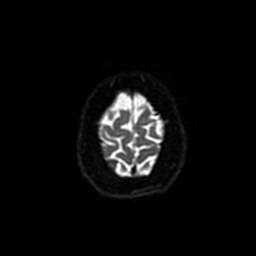
[im 108/108]
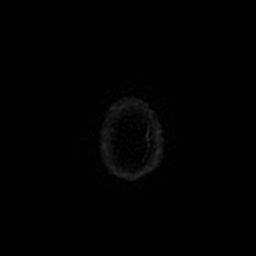

[Series 4: T1 · sagittal · 5.0mm · 0.47mm/px · 2 of 26 slices shown]
[im 1/26]
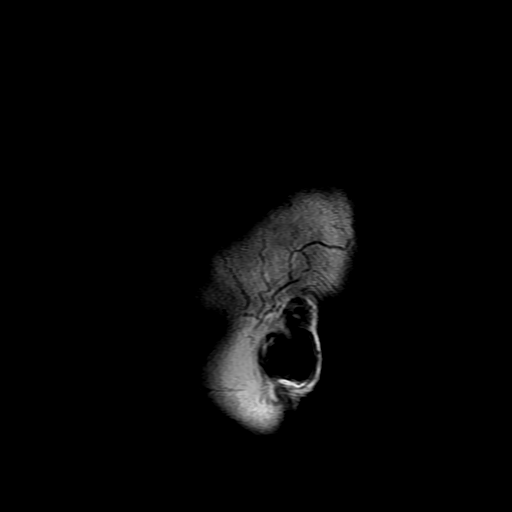
[im 26/26]
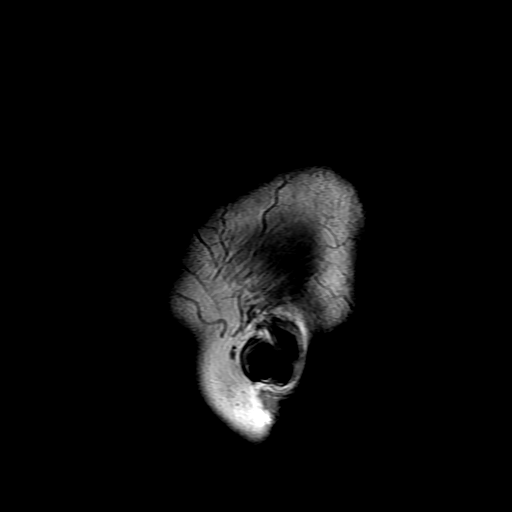

[Series 5: DWI · coronal · 5.0mm · 1.09mm/px · 8 of 73 slices shown (2 of 4)]
[im 1/73]
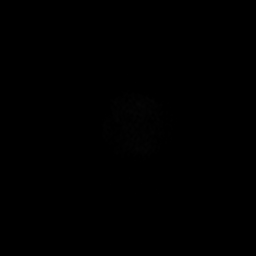
[im 11/73]
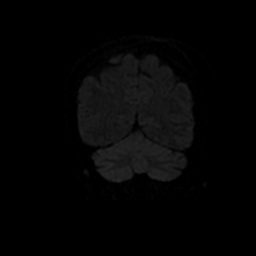
[im 21/73]
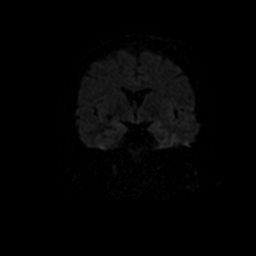
[im 31/73]
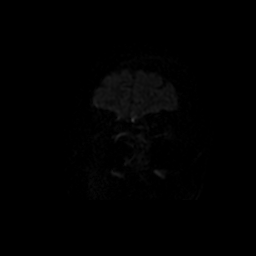
[im 42/73]
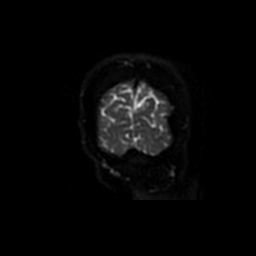
[im 52/73]
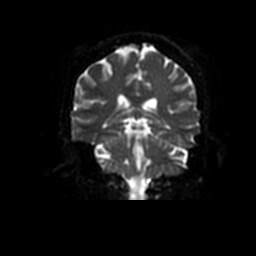
[im 62/73]
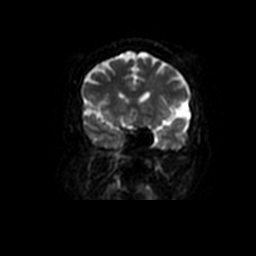
[im 73/73]
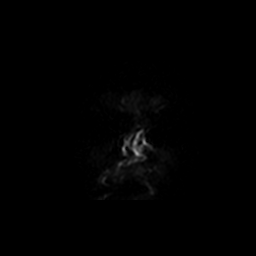

[Series 6: T2 · axial · 5.0mm · 0.43mm/px · z∈[-109,+49]mm · 3 of 24 slices shown (1 of 2)]
[im 1/24]
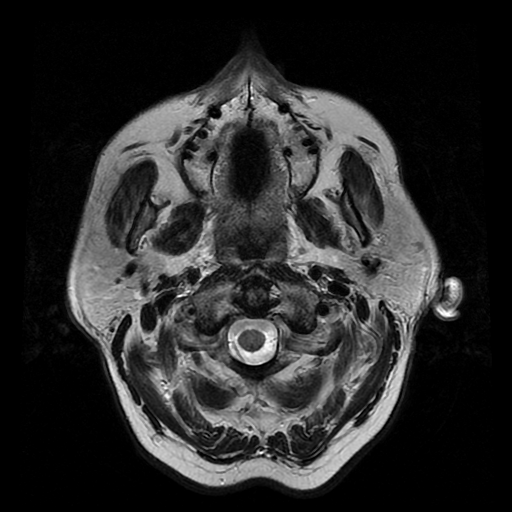
[im 12/24]
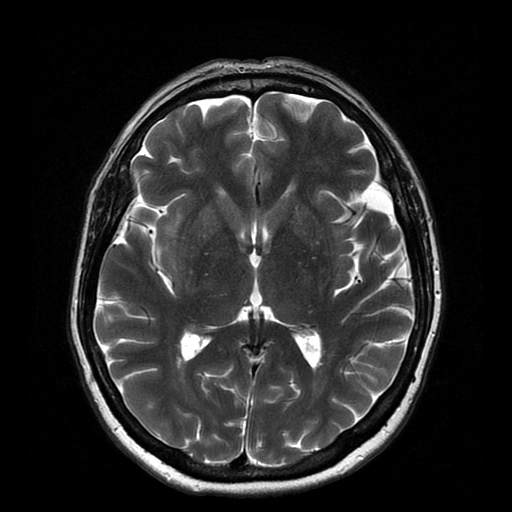
[im 24/24]
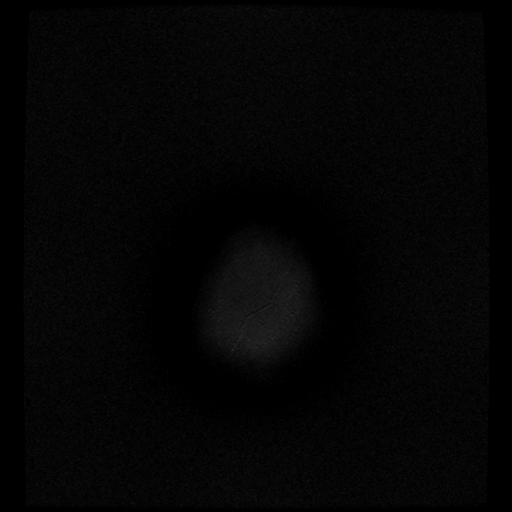

[Series 7: FLAIR · axial · 5.0mm · 0.43mm/px · z∈[-109,+49]mm · 3 of 24 slices shown]
[im 1/24]
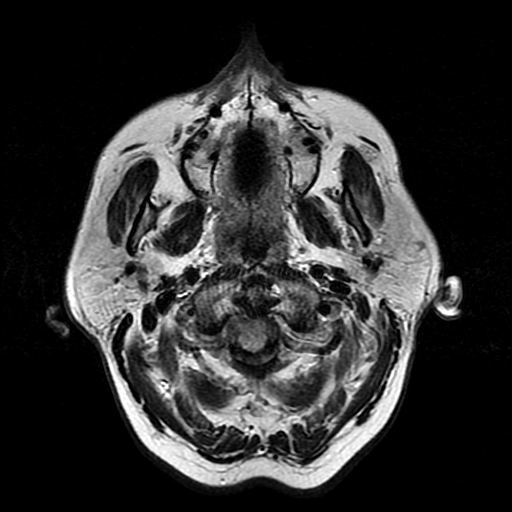
[im 12/24]
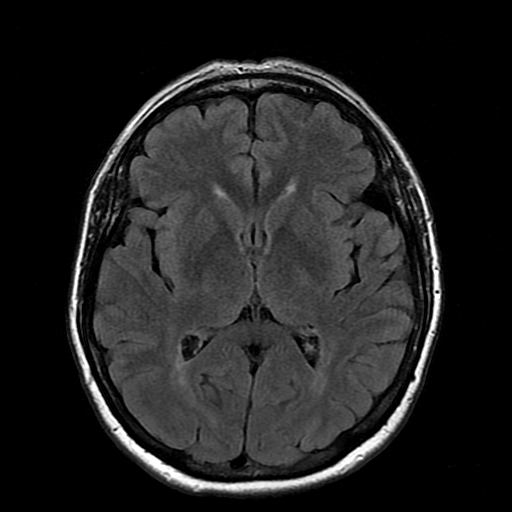
[im 24/24]
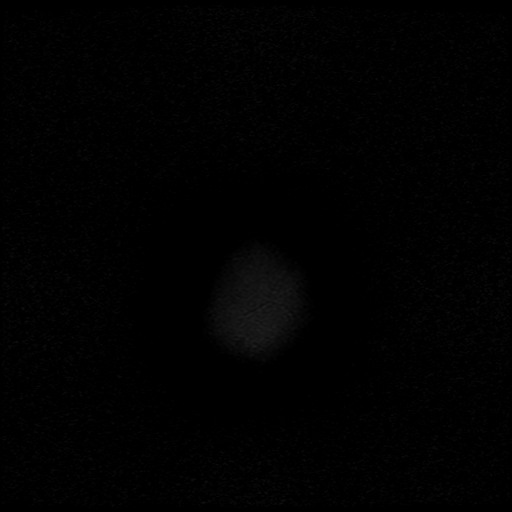

[Series 10: T2 · coronal · 5.0mm · 0.45mm/px · 3 of 27 slices shown (2 of 2)]
[im 1/27]
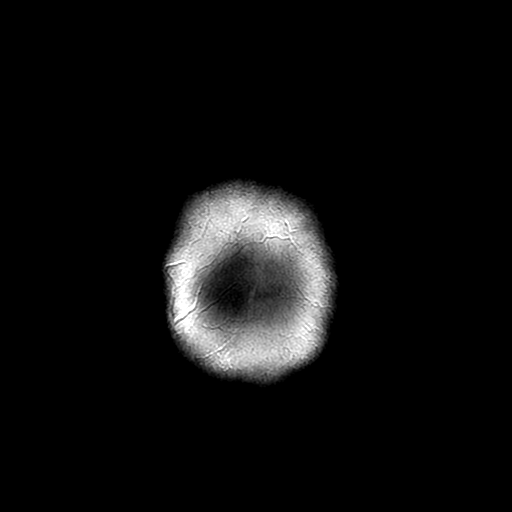
[im 14/27]
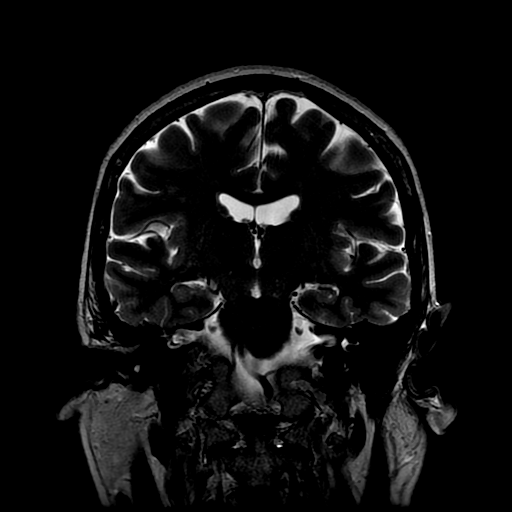
[im 27/27]
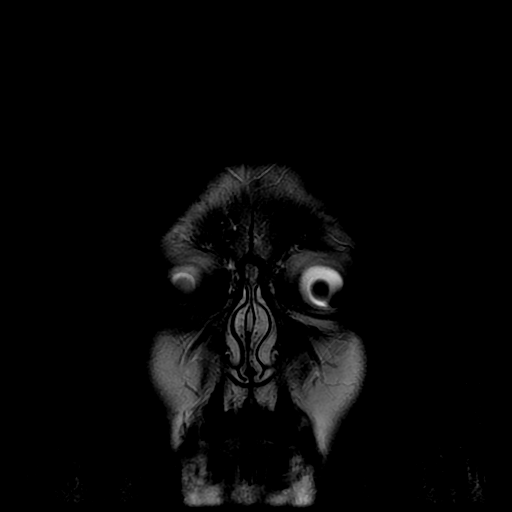

[Series 300: DWI · axial · 3.0mm · 1.09mm/px · z∈[-136,+23]mm · 6 of 54 slices shown (3 of 4)]
[im 1/54]
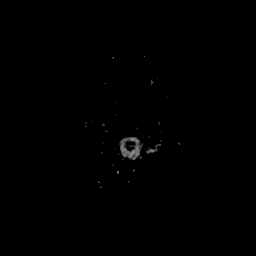
[im 11/54]
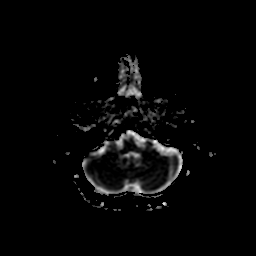
[im 22/54]
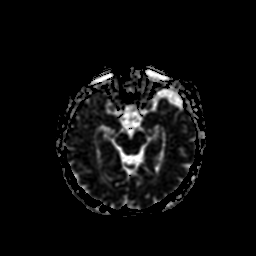
[im 32/54]
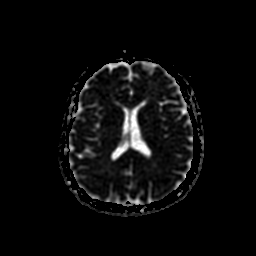
[im 43/54]
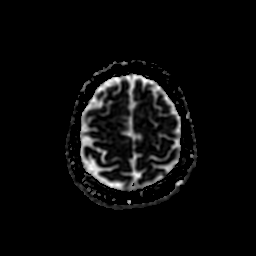
[im 54/54]
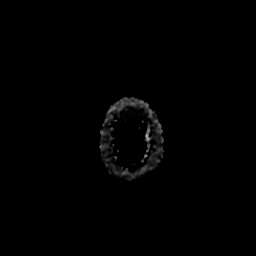

[Series 500: DWI · coronal · 5.0mm · 1.09mm/px · 4 of 37 slices shown (4 of 4)]
[im 1/37]
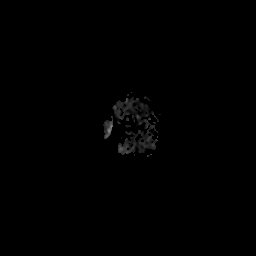
[im 13/37]
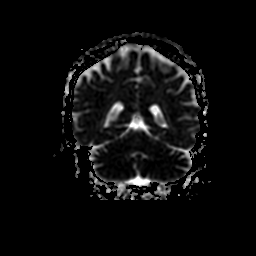
[im 25/37]
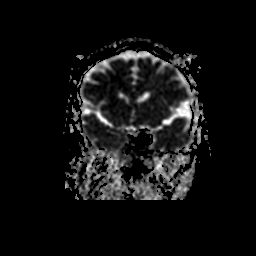
[im 37/37]
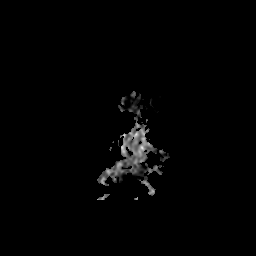

[37 of 48 positions shown; findings below may reference images not displayed]

FINDINGS: BRAIN: There is no acute infarct, acute hemorrhage or mass effect.
The midline structures are normal. Minimal white matter
hyperintensity, nonspecific and commonly seen in asymptomatic
patients of this age. Partially calcified right vertex meningioma
measures 17 x 12 mm. No signal abnormality of the subjacent brain.
There are no old infarcts. Left middle cranial fossa arachnoid cyst.
There is an enlarged perivascular space of the right lentiform
nucleus. Susceptibility-sensitive sequences show no chronic
microhemorrhage or superficial siderosis.

VASCULAR: Major intracranial arterial and venous sinus flow voids
are preserved.

SKULL AND UPPER CERVICAL SPINE: The visualized skull base,
calvarium, upper cervical spine and extracranial soft tissues are
normal.

SINUSES/ORBITS: No fluid levels or advanced mucosal thickening. No
mastoid or middle ear effusion. The orbits are normal.
IMPRESSION: 1. No acute abnormality.
2. Partially calcified right vertex meningioma without abnormality
of the underlying brain.
